# Patient Record
Sex: Male | Born: 1990 | Race: Black or African American | Hispanic: No | Marital: Single | Smoking: Never smoker
Health system: Southern US, Community
[De-identification: ages and names within clinical notes are randomized; demographics above are authoritative.]

## PROBLEM LIST (undated history)

## (undated) DIAGNOSIS — J45909 Unspecified asthma, uncomplicated: Secondary | ICD-10-CM

---
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
## 2010-01-21 ENCOUNTER — Telehealth (INDEPENDENT_AMBULATORY_CARE_PROVIDER_SITE_OTHER): Payer: Self-pay | Admitting: *Deleted

## 2010-01-23 ENCOUNTER — Ambulatory Visit: Payer: Self-pay | Admitting: Family Medicine

## 2010-02-24 ENCOUNTER — Ambulatory Visit: Payer: Self-pay | Admitting: Family Medicine

## 2010-03-25 ENCOUNTER — Encounter: Payer: Self-pay | Admitting: Family Medicine

## 2010-03-25 ENCOUNTER — Ambulatory Visit: Payer: Self-pay | Admitting: Family Medicine

## 2010-03-25 DIAGNOSIS — S0590XA Unspecified injury of unspecified eye and orbit, initial encounter: Secondary | ICD-10-CM

## 2010-08-10 ENCOUNTER — Encounter: Payer: Self-pay | Admitting: Family Medicine

## 2010-08-29 ENCOUNTER — Ambulatory Visit: Payer: Self-pay | Admitting: Family Medicine

## 2010-09-14 IMAGING — CR DG KNEE 1-2V*R*
2 series · 2 of 2 positions shown · non-contrast
Comparison: None.

CLINICAL DATA: 17-year-old male with right knee pain.  No known
injury.

RIGHT KNEE - 1-2 VIEW

[view not recorded (1 of 2)]
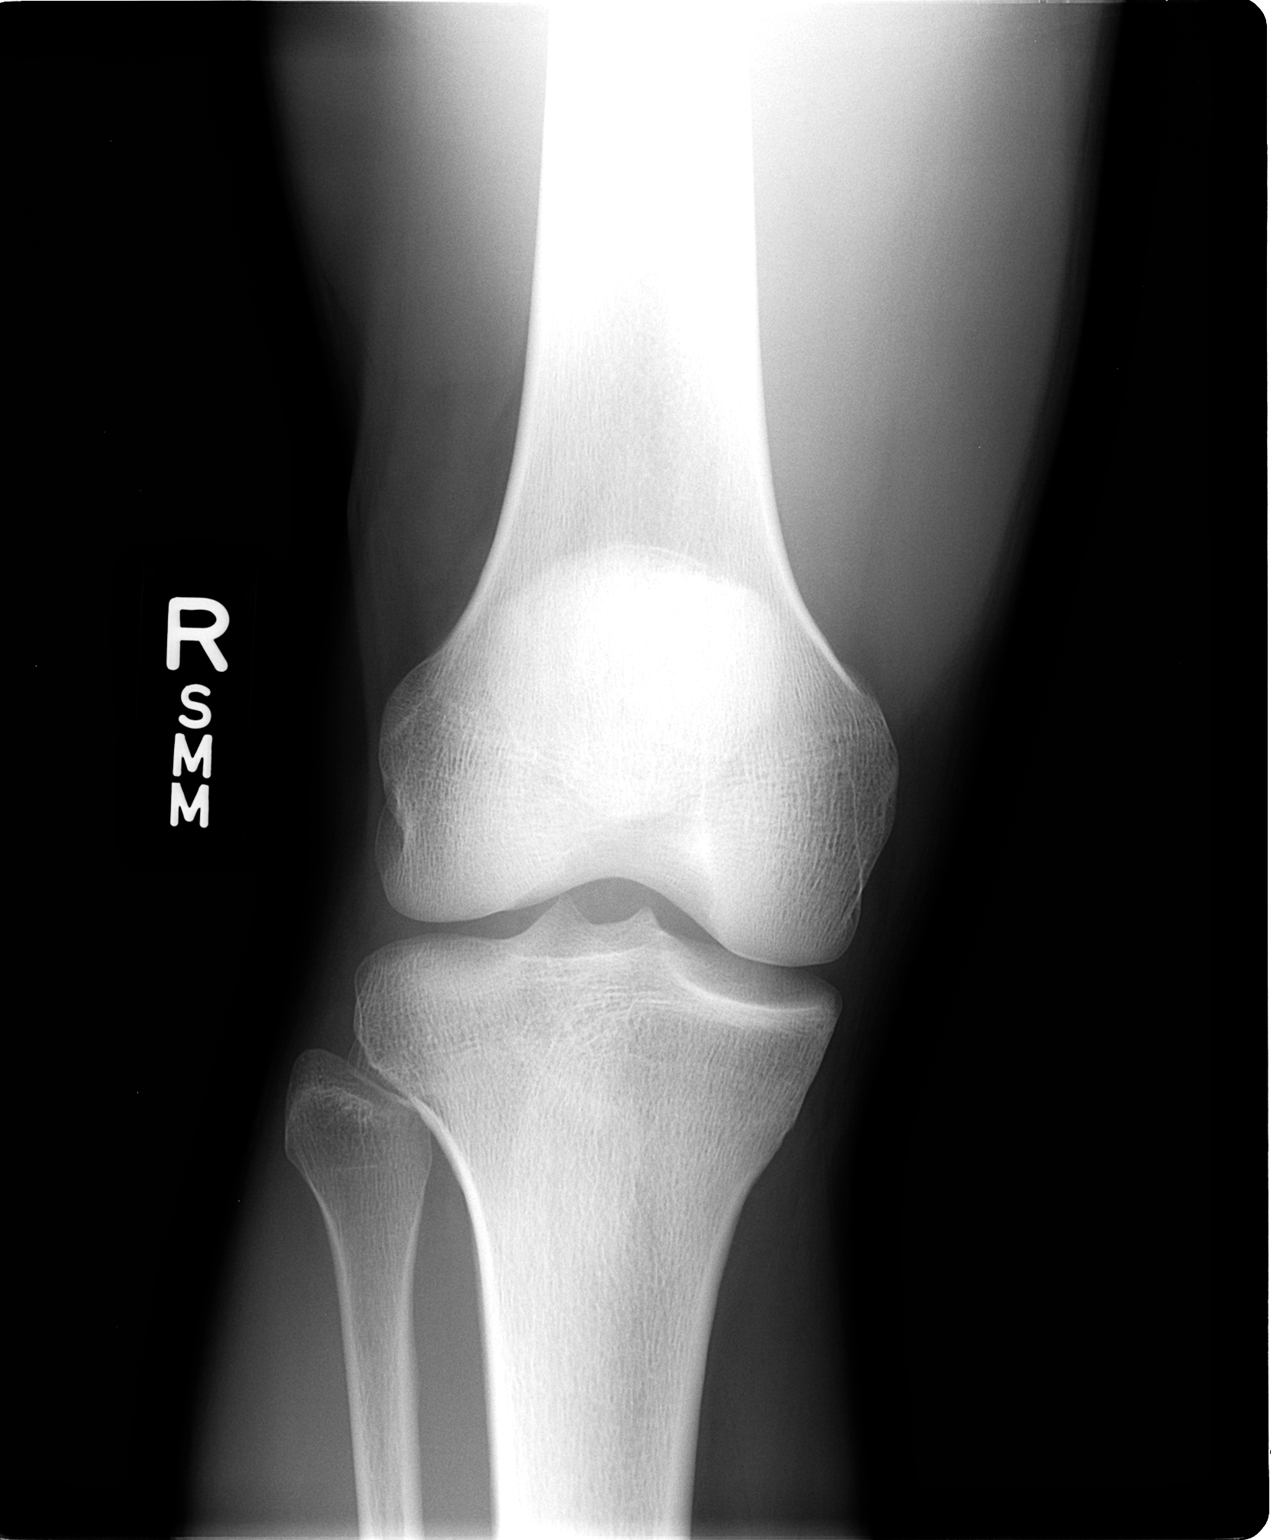

[view not recorded (2 of 2)]
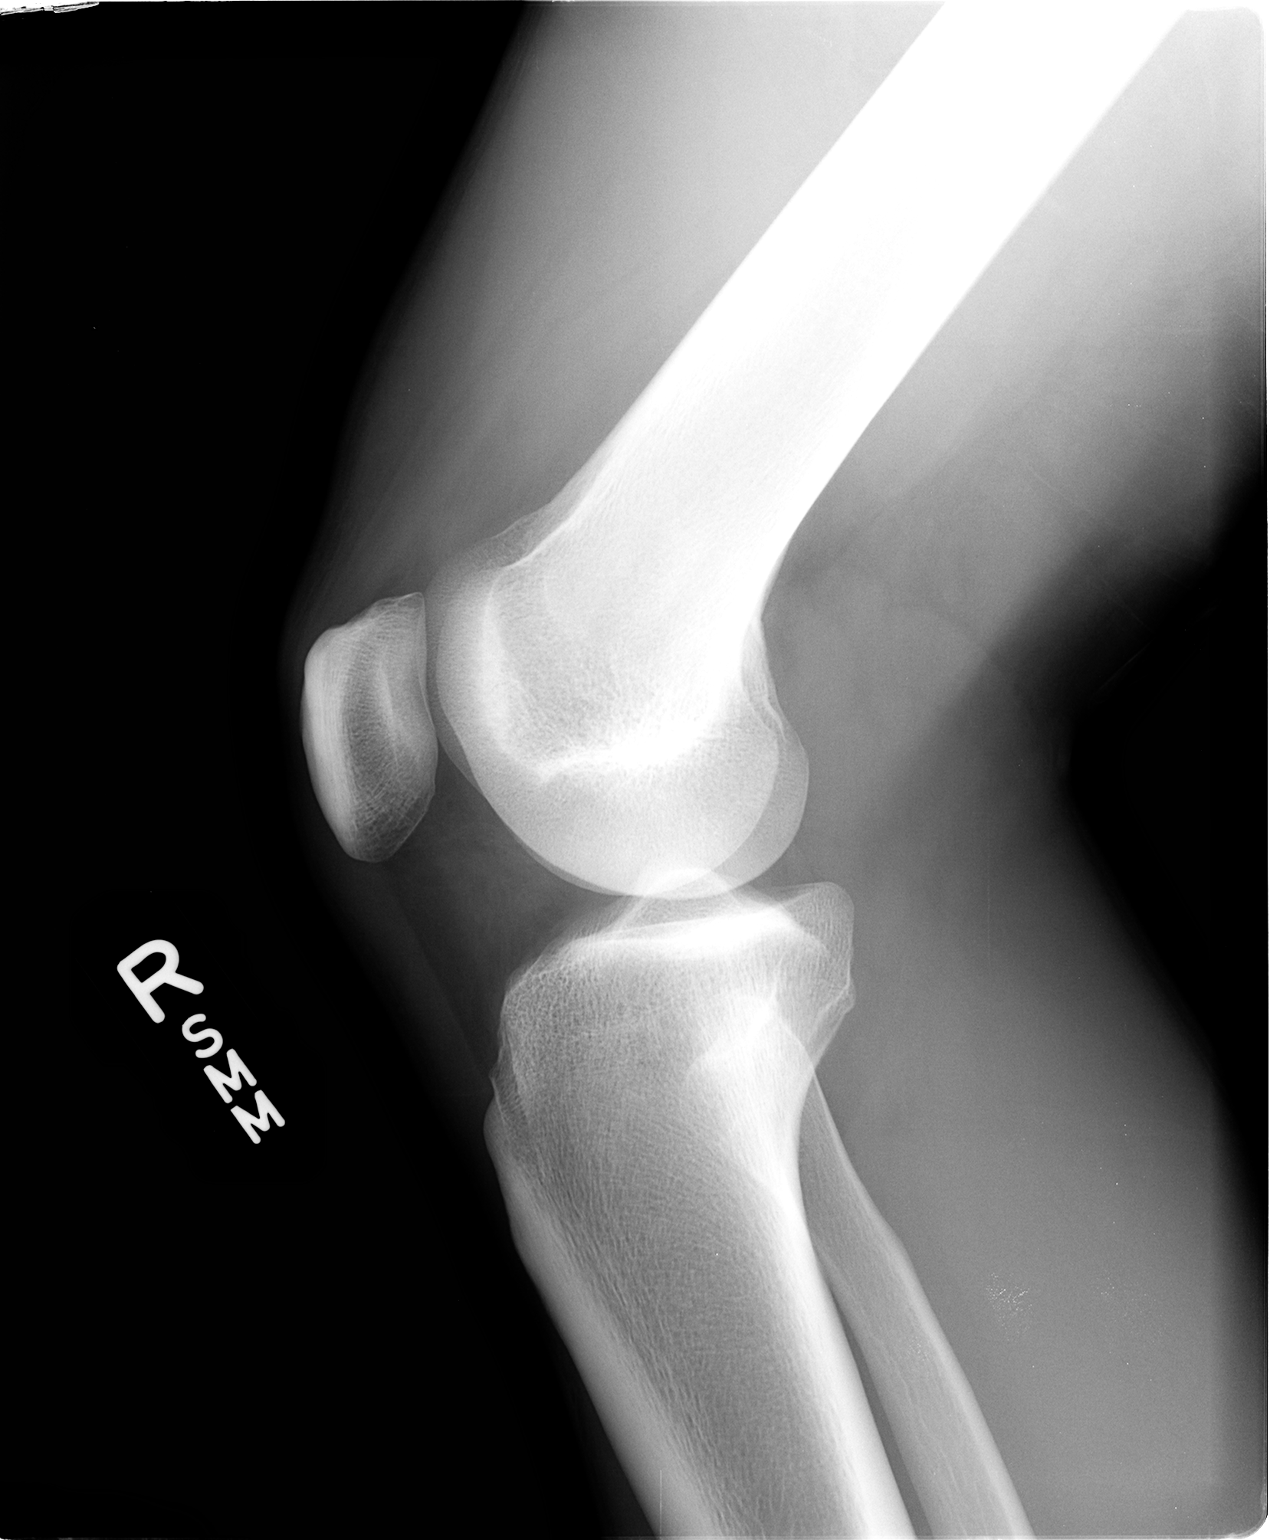

[2 of 2 positions shown; findings below may reference images not displayed]

FINDINGS: Bone mineralization is within normal limits.  No definite
joint effusion.  Soft tissue stranding in Hoffa's fat pad.  Joint
spaces are preserved.  No acute fracture.
IMPRESSION: Soft tissue stranding in Hoffa's fat pad which may reflect internal
derangement. No acute fracture or dislocation identified about the
right knee.

## 2010-12-04 NOTE — Progress Notes (Signed)
Summary: triage  Phone Note Call from Patient Call back at [phone]   Caller: mom-Sondra Summary of Call: mom thinks he is due for more shots.  Just wanting to know if this is right or is he up to date. Initial call taken by: Clydell Hakim,  January 21, 2010 3:05 PM  Follow-up for Phone Call        he has had a Tdap & Benedict Needy so I do not think he gets any at this time. mom says she has a note at home with info from a nurse that he was to come back. she will look at the note & call if he needs something else Follow-up by: Golden Circle RN,  January 21, 2010 3:07 PM  Additional Follow-up for Phone Call Additional follow up Details #1::        To [facility] Team to confirm he is up to date. Additional Follow-up by: Romero Belling MD,  January 21, 2010 3:28 PM    Additional Follow-up for Phone Call Additional follow up Details #2::    [facility] team--what does he need? Follow-up by: Romero Belling MD,  January 22, 2010 2:01 PM  Additional Follow-up for Phone Call Additional follow up Details #3:: Details for Additional Follow-up Action Taken: Looks like he needs Hep B series as well as Hep A series.  LMOVM for mom to call back.  Will forward to admin team Additional Follow-up by: Jone Baseman CMA,  January 22, 2010 2:24 PM   Appended Document: triage Mom notified of what son needs and appointment made.

## 2010-12-04 NOTE — Miscellaneous (Signed)
Summary: "gash"  Clinical Lists Changes mom states the school just called her. he bumped heads with someone else & has a "gash" on his forehead. told her to apply ice & bring him in. she states the school "just put a bandaid on it". appt in work in.Golden Circle RN  Mar 25, 2010 2:10 PM

## 2010-12-04 NOTE — Assessment & Plan Note (Signed)
Summary: 3rd hep B,df  Nurse Visit  Hep A  # 2 and Hep B # 3 given . Entered in [place]. Theresia Lo RN  August 29, 2010 11:55 AM  Vital Signs:  Patient profile:   20 year old male Temp:     98.3 degrees F  Vitals Entered By: Theresia Lo RN (August 29, 2010 11:55 AM)  Allergies: No Known Drug Allergies  Orders Added: 1)  Admin 1st Vaccine [90471] 2)  Admin of Any Addtl Vaccine [13086]

## 2010-12-04 NOTE — Assessment & Plan Note (Signed)
Summary: Hep A & B series,tcb  Nurse Visit Hep B and Hep A given . entered in [place] Theresia Lo RN  January 23, 2010 4:30 PM   Vital Signs:  Patient profile:   20 year old male Temp:     98.6 degrees F  Vitals Entered By: Theresia Lo RN (January 23, 2010 4:30 PM)  Orders Added: 1)  Admin 1st Vaccine [facility]) [phone] 2)  Admin of Any Addtl Vaccine [facility]) [phone]

## 2010-12-04 NOTE — Assessment & Plan Note (Signed)
Summary: cut on head/[place]   Vital Signs:  Patient profile:   20 year old male Height:      71 inches Weight:      154 pounds BMI:     21.56 BSA:     1.89 Temp:     98.2 degrees F Pulse rate:   61 / minute BP sitting:   116 / 79  Vitals Entered By: Jone Baseman CMA (Mar 25, 2010 3:43 PM) CC: cut on head Pain Assessment Patient in pain? no       Vision Screening:Left eye w/o correction: 20 / 25 Right Eye w/o correction: 20 / 20 Both eyes w/o correction:  20/ 16        Vision Entered By: Jone Baseman CMA (Mar 25, 2010 3:51 PM)   Primary Care Provider:  Romero Belling MD  CC:  cut on head.  History of Present Illness: cut on forehead: was playing basketball at school earlier in the day, bumped heads with another player and developed cut along left eye brow.  bled intiially but able to control that with bandage.  still oozing a little.  no pain.  no LOC.  no confusion.  no nausea or vomiting.  no eye pain.  normal eye movement.  normal vision.   Habits & Providers  Alcohol-Tobacco-Diet     Tobacco Status: never  Current Medications (verified): 1)  Albuterol 90 Mcg/act Aers (Albuterol) .... 2 Inh Q4h As Needed Disp 1 Inhaler  Allergies (verified): No Known Drug Allergies  Review of Systems       per HPI  Physical Exam  General:  Well appearing adolescent,no acute distress Eyes:  PERRL.  EOMI.  sclera and conjunciva noninjected vision screening WNL Skin:  approx 2 inch laceration to at left eye just inferior and lateral to eyebrow with approximately 4mm of gaping.  very superficial.  minimal oozing.  moderated swelling of soft tissues.  nontender.   wound cleaned out and steri-stripped shut as patient refused stitches.    Impression & Recommendations:  Problem # 1:  EYE INJURY (ICD-918.9) recommended stitching but patient refused.  attempted approxiamation of edges with steristrips with relatively good approximation after irrigating with 30cc  normal saline.  red flags for return reviewed of note vision [phone][facility]- Est Level  3 ([id number])  Complete Medication List: 1)  Albuterol 90 Mcg/act Aers (Albuterol) .... 2 inh q4h as needed disp 1 inhaler

## 2010-12-04 NOTE — Assessment & Plan Note (Signed)
Summary: hep-b,df  Nurse Visit Hep B # 2 given Entered in [facility]. Theresia Lo RN  February 24, 2010 4:19 PM   Vital Signs:  Patient profile:   20 year old male Temp:     98.2 degrees F oral  Vitals Entered By: Theresia Lo RN (February 24, 2010 4:19 PM)  Orders Added: 1)  Admin 1st Vaccine [phone]

## 2010-12-14 ENCOUNTER — Encounter: Payer: Self-pay | Admitting: *Deleted

## 2017-11-28 ENCOUNTER — Emergency Department (HOSPITAL_COMMUNITY)
Admission: EM | Admit: 2017-11-28 | Discharge: 2017-11-28 | Disposition: A | Discharge status: Home / Self Care | Payer: Managed Care, Other (non HMO) | Attending: Emergency Medicine | Admitting: Emergency Medicine

## 2017-11-28 ENCOUNTER — Emergency Department (HOSPITAL_COMMUNITY): Payer: Managed Care, Other (non HMO)

## 2017-11-28 ENCOUNTER — Other Ambulatory Visit: Payer: Self-pay

## 2017-11-28 ENCOUNTER — Encounter (HOSPITAL_COMMUNITY): Payer: Self-pay

## 2017-11-28 DIAGNOSIS — R072 Precordial pain: Secondary | ICD-10-CM | POA: Diagnosis not present

## 2017-11-28 DIAGNOSIS — Z79899 Other long term (current) drug therapy: Secondary | ICD-10-CM | POA: Insufficient documentation

## 2017-11-28 DIAGNOSIS — J45909 Unspecified asthma, uncomplicated: Secondary | ICD-10-CM | POA: Insufficient documentation

## 2017-11-28 DIAGNOSIS — F419 Anxiety disorder, unspecified: Secondary | ICD-10-CM | POA: Insufficient documentation

## 2017-11-28 DIAGNOSIS — R0602 Shortness of breath: Secondary | ICD-10-CM | POA: Diagnosis present

## 2017-11-28 HISTORY — DX: Unspecified asthma, uncomplicated: J45.909

## 2017-11-28 LAB — RAPID URINE DRUG SCREEN, HOSP PERFORMED
Amphetamines: NOT DETECTED
BENZODIAZEPINES: NOT DETECTED
Barbiturates: NOT DETECTED
COCAINE: NOT DETECTED
Opiates: NOT DETECTED
Tetrahydrocannabinol: NOT DETECTED

## 2017-11-28 LAB — I-STAT TROPONIN, ED
TROPONIN I, POC: 0 ng/mL (ref 0.00–0.08)
Troponin i, poc: 0 ng/mL (ref 0.00–0.08)

## 2017-11-28 LAB — BASIC METABOLIC PANEL
Anion gap: 10 (ref 5–15)
BUN: 11 mg/dL (ref 6–20)
CO2: 26 mmol/L (ref 22–32)
Calcium: 9.1 mg/dL (ref 8.9–10.3)
Chloride: 103 mmol/L (ref 101–111)
Creatinine, Ser: 1.07 mg/dL (ref 0.61–1.24)
GFR calc Af Amer: >60 mL/min (ref >60)
Glucose, Bld: 122 mg/dL — ABNORMAL HIGH (ref 65–99)
POTASSIUM: 3.6 mmol/L (ref 3.5–5.1)
SODIUM: 139 mmol/L (ref 135–145)

## 2017-11-28 LAB — CBC
HEMATOCRIT: 44.2 % (ref 39.0–52.0)
HEMOGLOBIN: 15.8 g/dL (ref 13.0–17.0)
MCH: 31.2 pg (ref 26.0–34.0)
MCHC: 35.7 g/dL (ref 30.0–36.0)
MCV: 87.4 fL (ref 78.0–100.0)
Platelets: 209 10*3/uL (ref 150–400)
RBC: 5.06 MIL/uL (ref 4.22–5.81)
RDW: 13.4 % (ref 11.5–15.5)
WBC: 6 10*3/uL (ref 4.0–10.5)

## 2017-11-28 LAB — D-DIMER, QUANTITATIVE (NOT AT ARMC)

## 2017-11-28 NOTE — ED Provider Notes (Addendum)
[facility] Provider Note   CSN: [id number] Arrival date & time: 11/28/17  0308     History   Chief Complaint Chief Complaint  Patient presents with  . Shortness of Breath  . Chest Pain    HPI Victor Cobb is a 27 y.o. male.  The history is provided by the patient.  Shortness of Breath  This is a new problem. The average episode lasts 4 hours. The problem occurs continuously.The current episode started 3 to 5 hours ago. The problem has not changed since onset.Associated symptoms include chest pain. Pertinent negatives include no fever, no headaches, no coryza, no rhinorrhea, no sore throat, no swollen glands, no ear pain, no neck pain, no cough, no sputum production, no hemoptysis, no wheezing, no PND, no orthopnea, no syncope, no vomiting, no abdominal pain, no rash, no leg pain, no leg swelling and no claudication. Associated symptoms comments: Feels shaky inside his chest . It is unknown what precipitated the problem. Risk factors: none no sitting no surgery no long car trips or plane trips.  No exertional symptoms.  Chest Pain   Associated symptoms include shortness of breath. Pertinent negatives include no abdominal pain, no back pain, no claudication, no cough, no fever, no headaches, no hemoptysis, no leg pain, no orthopnea, no palpitations, no PND, no sputum production, no syncope and no vomiting.    Past Medical History:  Diagnosis Date  . Asthma     Patient Active Problem List   Diagnosis Date Noted  . EYE INJURY 03/25/2010  . ASTHMA, INTERMITTENT 12/30/2006    History reviewed. No pertinent surgical history.     Home Medications    Prior to Admission medications   Medication Sig Start Date End Date Taking? Authorizing Provider  albuterol (PROVENTIL,VENTOLIN) 90 MCG/ACT inhaler Inhale 2 puffs into the lungs every 4 (four) hours as needed.      [provider]    Family History No family history on  file.  Social History Social History   Tobacco Use  . Smoking status: Never Smoker  . Smokeless tobacco: Never Used  Substance Use Topics  . Alcohol use: Yes  . Drug use: No     Allergies   Patient has no known allergies.   Review of Systems Review of Systems  Constitutional: Negative for fever.  HENT: Negative for ear pain, rhinorrhea and sore throat.   Respiratory: Positive for shortness of breath. Negative for cough, hemoptysis, sputum production and wheezing.   Cardiovascular: Positive for chest pain. Negative for palpitations, orthopnea, claudication, leg swelling, syncope and PND.  Gastrointestinal: Negative for abdominal pain and vomiting.  Musculoskeletal: Negative for back pain and neck pain.  Skin: Negative for rash.  Neurological: Negative for headaches.  All other systems reviewed and are negative.    Physical Exam Updated Vital Signs BP 124/75   Pulse 65   Temp 98.2 F (36.8 C)   Resp 17   SpO2 100%   Physical Exam  Constitutional: He is oriented to person, place, and time. He appears well-developed and well-nourished. No distress.  HENT:  Head: Normocephalic and atraumatic.  Mouth/Throat: Oropharynx is clear and moist. No oropharyngeal exudate.  Eyes: Conjunctivae are normal. Pupils are equal, round, and reactive to light.  Neck: Normal range of motion. Neck supple. No JVD present.  Cardiovascular: Normal rate, regular rhythm, normal heart sounds and intact distal pulses.  Pulmonary/Chest: Effort normal and breath sounds normal. No stridor. He has no wheezes. He has  no rales.  Abdominal: Soft. Bowel sounds are normal. He exhibits no mass. There is no tenderness. There is no rebound and no guarding.  Musculoskeletal: Normal range of motion. He exhibits no tenderness.  Neurological: He is alert and oriented to person, place, and time. He displays normal reflexes.  Skin: Skin is warm and dry. Capillary refill takes less than 2 seconds.  Psychiatric:  His mood appears anxious.     ED Treatments / Results  Labs (all labs ordered are listed, but only abnormal results are displayed)  Results for orders placed or performed during the hospital encounter of 11/28/17  Basic metabolic panel  Result Value Ref Range   Sodium 139 135 - 145 mmol/L   Potassium 3.6 3.5 - 5.1 mmol/L   Chloride 103 101 - 111 mmol/L   CO2 26 22 - 32 mmol/L   Glucose, Bld 122 (H) 65 - 99 mg/dL   BUN 11 6 - 20 mg/dL   Creatinine, Ser 3.24 0.61 - 1.24 mg/dL   Calcium 9.1 8.9 - 40.1 mg/dL   GFR calc non Af Amer >60 >60 mL/min   GFR calc Af Amer >60 >60 mL/min   Anion gap 10 5 - 15  CBC  Result Value Ref Range   WBC 6.0 4.0 - 10.5 K/uL   RBC 5.06 4.22 - 5.81 MIL/uL   Hemoglobin 15.8 13.0 - 17.0 g/dL   HCT 02.7 25.3 - 66.4 %   MCV 87.4 78.0 - 100.0 fL   MCH 31.2 26.0 - 34.0 pg   MCHC 35.7 30.0 - 36.0 g/dL   RDW 40.3 47.4 - 25.9 %   Platelets 209 150 - 400 K/uL  D-dimer, quantitative (not at [facility])  Result Value Ref Range   D-Dimer, Quant <0.27 0.00 - 0.50 ug/mL-FEU  Rapid urine drug screen (hospital performed)  Result Value Ref Range   Opiates NONE DETECTED NONE DETECTED   Cocaine NONE DETECTED NONE DETECTED   Benzodiazepines NONE DETECTED NONE DETECTED   Amphetamines NONE DETECTED NONE DETECTED   Tetrahydrocannabinol NONE DETECTED NONE DETECTED   Barbiturates NONE DETECTED NONE DETECTED  I-stat troponin, ED  Result Value Ref Range   Troponin i, poc 0.00 0.00 - 0.08 ng/mL   Comment 3           Dg Chest 2 View  Result Date: 11/28/2017 CLINICAL DATA:  Chest pain short of breath EXAM: CHEST  2 VIEW COMPARISON:  None. FINDINGS: The heart size and mediastinal contours are within normal limits. Both lungs are clear. The visualized skeletal structures are unremarkable. IMPRESSION: No active cardiopulmonary disease. Electronically Signed   By: Jasmine Pang M.D.   On: 11/28/2017 03:51    EKG  EKG Interpretation  Date/Time:  Sunday November 28 2017 03:18:09  EST Ventricular Rate:  72 PR Interval:  180 QRS Duration: 84 QT Interval:  362 QTC Calculation: 396 R Axis:   86 Text Interpretation:  Normal sinus rhythm LVH by voltage Confirmed by Nicanor Alcon, Paislei Dorval ([id number]) on 11/28/2017 4:34:30 AM       Radiology Dg Chest 2 View  Result Date: 11/28/2017 CLINICAL DATA:  Chest pain short of breath EXAM: CHEST  2 VIEW COMPARISON:  None. FINDINGS: The heart size and mediastinal contours are within normal limits. Both lungs are clear. The visualized skeletal structures are unremarkable. IMPRESSION: No active cardiopulmonary disease. Electronically Signed   By: Jasmine Pang M.D.   On: 11/28/2017 03:51    Procedures Procedures (including critical care time)  Medications Ordered in  ED Medications - No data to display   Ruled out for MI and PE in the ED.  HEART score is 0.  Very low risk for MACE.  Negative ddimer in a low risk patient excludes this diagnosis.    Final Clinical Impressions(s) / ED Diagnoses  Symptoms consistent with anxiety.  Shakiness inside and patient's demeanor are consistent with this.  I am not sure what brought this on as patient will not say with parents present.  Patient is stable for discharge with close follow up.    Return for weakness, numbness, changes in vision or speech,  fevers > 100.4 unrelieved by medication, shortness of breath, intractable vomiting, or diarrhea, abdominal pain, Inability to tolerate liquids or food, cough, altered mental status or any concerns. No signs of systemic illness or infection. The patient is nontoxic-appearing on exam and vital signs are within normal limits.    I have reviewed the triage vital signs and the nursing notes. Pertinent labs &imaging results that were available during my care of the patient were reviewed by me and considered in my medical decision making (see chart for details).  After history, exam, and medical workup I feel the patient has been appropriately medically  screened and is safe for discharge home. Pertinent diagnoses were discussed with the patient. Patient was given return precautions.     Janifer Gieselman, MD 11/28/17 1096    Cy Blamer, MD 11/28/17 0454

## 2017-11-28 NOTE — ED Triage Notes (Signed)
Pt reports that about 30 minutes ago began to have SOB with central CP, denies other cardiac symptoms.

## 2019-02-07 IMAGING — DX DG CHEST 2V
2 series · 2 of 2 positions shown · non-contrast
Comparison: None.

CLINICAL DATA: Chest pain short of breath

EXAM:
CHEST  2 VIEW

[chest pa]
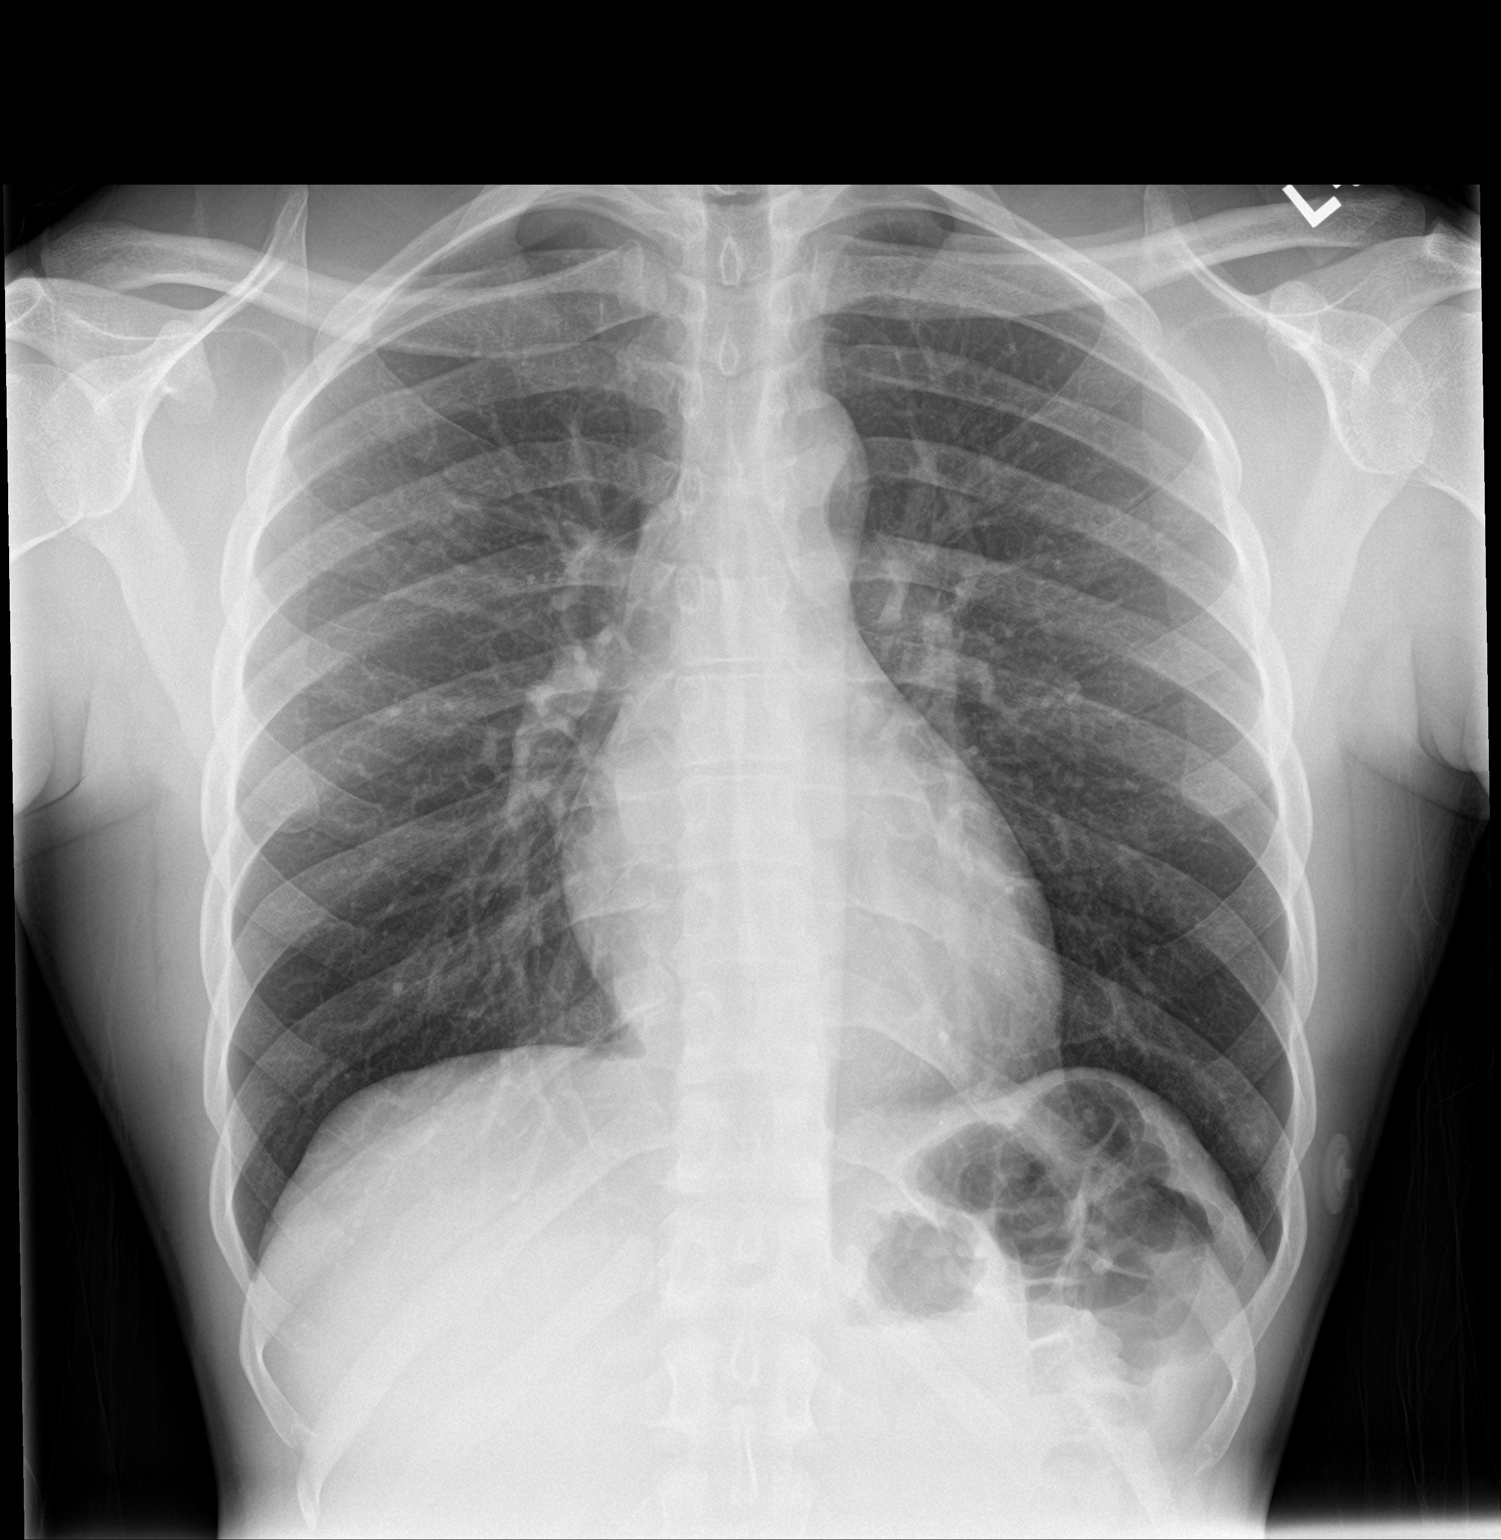

[chest lat]
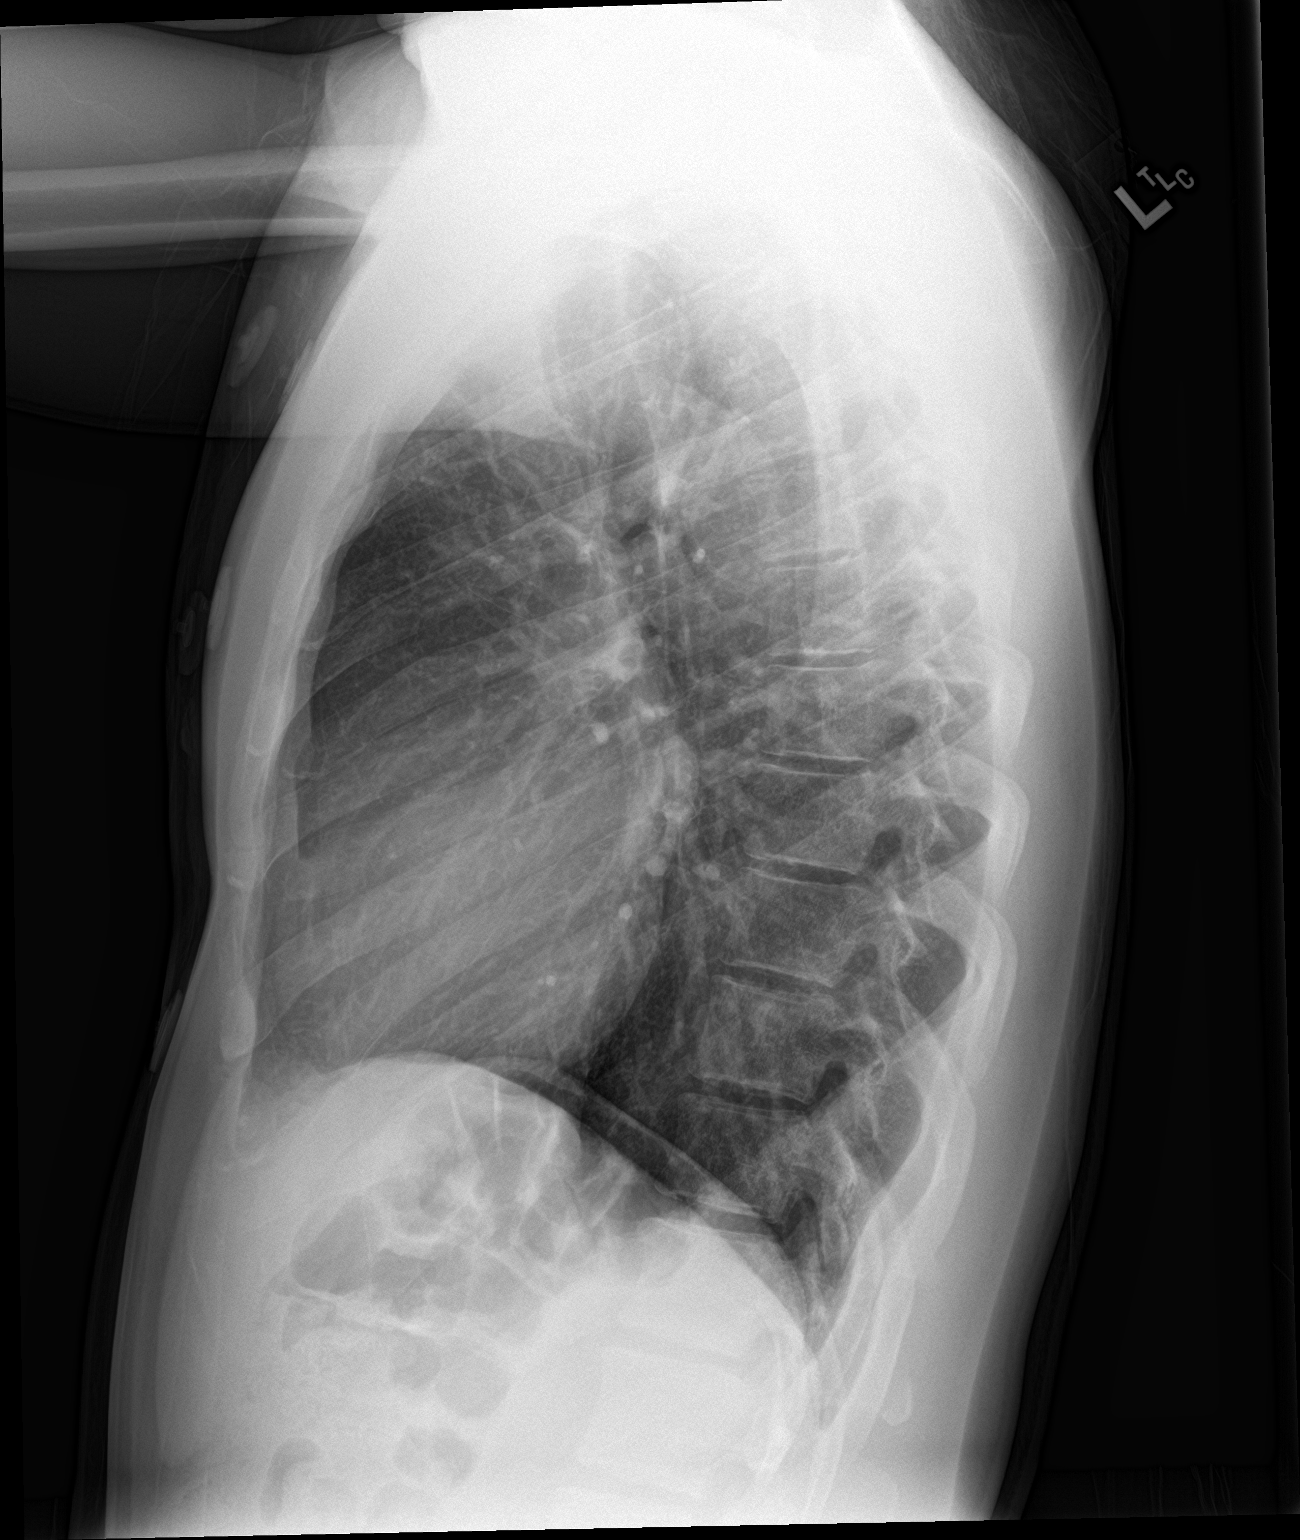

[2 of 2 positions shown; findings below may reference images not displayed]

FINDINGS: The heart size and mediastinal contours are within normal limits.
Both lungs are clear. The visualized skeletal structures are
unremarkable.
IMPRESSION: No active cardiopulmonary disease.

## 2020-03-23 ENCOUNTER — Ambulatory Visit: Payer: Self-pay | Attending: Internal Medicine
